# Patient Record
Sex: Female | Born: 1990 | Hispanic: Yes | Marital: Married | Smoking: Former smoker
Health system: Southern US, Community
[De-identification: ages and names within clinical notes are randomized; demographics above are authoritative.]

## PROBLEM LIST (undated history)

## (undated) ENCOUNTER — Inpatient Hospital Stay (HOSPITAL_COMMUNITY): Payer: Self-pay

## (undated) DIAGNOSIS — Z789 Other specified health status: Secondary | ICD-10-CM

## (undated) DIAGNOSIS — O3680X0 Pregnancy with inconclusive fetal viability, not applicable or unspecified: Secondary | ICD-10-CM

## (undated) DIAGNOSIS — N390 Urinary tract infection, site not specified: Secondary | ICD-10-CM

## (undated) HISTORY — PX: DILATION AND CURETTAGE OF UTERUS: SHX78

---

## 2015-11-25 ENCOUNTER — Inpatient Hospital Stay (HOSPITAL_COMMUNITY)
Admission: AD | Admit: 2015-11-25 | Discharge: 2015-11-25 | Disposition: A | Discharge status: Home / Self Care | Source: Ambulatory Visit | Attending: Obstetrics & Gynecology | Admitting: Obstetrics & Gynecology

## 2015-11-25 ENCOUNTER — Encounter (HOSPITAL_COMMUNITY): Payer: Self-pay | Admitting: *Deleted

## 2015-11-25 DIAGNOSIS — R102 Pelvic and perineal pain: Secondary | ICD-10-CM | POA: Diagnosis present

## 2015-11-25 DIAGNOSIS — O9989 Other specified diseases and conditions complicating pregnancy, childbirth and the puerperium: Secondary | ICD-10-CM

## 2015-11-25 DIAGNOSIS — R109 Unspecified abdominal pain: Secondary | ICD-10-CM

## 2015-11-25 DIAGNOSIS — O26893 Other specified pregnancy related conditions, third trimester: Secondary | ICD-10-CM | POA: Diagnosis not present

## 2015-11-25 DIAGNOSIS — Z87891 Personal history of nicotine dependence: Secondary | ICD-10-CM | POA: Diagnosis not present

## 2015-11-25 DIAGNOSIS — Z3A29 29 weeks gestation of pregnancy: Secondary | ICD-10-CM | POA: Diagnosis not present

## 2015-11-25 DIAGNOSIS — O26899 Other specified pregnancy related conditions, unspecified trimester: Secondary | ICD-10-CM

## 2015-11-25 HISTORY — DX: Urinary tract infection, site not specified: N39.0

## 2015-11-25 LAB — URINALYSIS, ROUTINE W REFLEX MICROSCOPIC
BILIRUBIN URINE: NEGATIVE
Glucose, UA: NEGATIVE mg/dL
Hgb urine dipstick: NEGATIVE
Ketones, ur: NEGATIVE mg/dL
LEUKOCYTES UA: NEGATIVE
NITRITE: NEGATIVE
Protein, ur: NEGATIVE mg/dL
SPECIFIC GRAVITY, URINE: 1.01 (ref 1.005–1.030)
pH: 6.5 (ref 5.0–8.0)

## 2015-11-25 LAB — POCT PREGNANCY, URINE: Preg Test, Ur: POSITIVE — AB

## 2015-11-25 NOTE — MAU Note (Signed)
Pt presents to MAU with complaints of lower abdominal since for the last couple of days. Denies any vaginal bleeding or abnormal discharge. PT has received care in [place] and just moved here. States she has one child born 2013 and lost twins in 2014 at 8 weeks.

## 2015-11-25 NOTE — Discharge Instructions (Signed)

## 2015-11-25 NOTE — Progress Notes (Signed)
Provider notified of pt arrival to MAU and pt complaints.  Provider states to continue to monitor the baby and notify her of urine results.

## 2015-11-25 NOTE — MAU Provider Note (Signed)
History   G3P1011 @ 29 wks in with c/o sharp shooting pains into vagina for 3 days.  CSN: [id number]  Arrival date & time 11/25/15  1651   None     Chief Complaint  Patient presents with  . Contractions    HPI  Past Medical History  Diagnosis Date  . UTI (urinary tract infection)     Past Surgical History  Procedure Laterality Date  . Dilation and curettage of uterus      History reviewed. No pertinent family history.  Social History  Substance Use Topics  . Smoking status: Former Games developer  . Smokeless tobacco: None  . Alcohol Use: No    OB History    Gravida Para Term Preterm AB TAB SAB Ectopic Multiple Living   Review of Systems  Constitutional: Negative.   HENT: Negative.   Eyes: Negative.   Respiratory: Negative.   Cardiovascular: Negative.   Gastrointestinal: Positive for abdominal pain.  Endocrine: Negative.   Genitourinary: Negative.   Musculoskeletal: Negative.   Skin: Negative.   Allergic/Immunologic: Negative.   Neurological: Negative.   Hematological: Negative.   Psychiatric/Behavioral: Negative.     Allergies  Review of patient's allergies indicates no known allergies.  Home Medications  No current outpatient prescriptions on file.  BP 108/84 mmHg  Pulse 79  Temp(Src) 98.2 F (36.8 C)  Resp 18  LMP 05/06/2015  Physical Exam  Constitutional: She is oriented to person, place, and time. She appears well-developed and well-nourished.  HENT:  Head: Normocephalic.  Eyes: Pupils are equal, round, and reactive to light.  Neck: Normal range of motion.  Cardiovascular: Normal rate, regular rhythm, normal heart sounds and intact distal pulses.   Pulmonary/Chest: Effort normal and breath sounds normal.  Abdominal: Soft. Bowel sounds are normal.  Genitourinary: Vagina normal.  Musculoskeletal: Normal range of motion.  Neurological: She is alert and oriented to person, place, and time. She has normal reflexes.  Skin:  Skin is warm and dry.  Psychiatric: She has a normal mood and affect. Her behavior is normal. Judgment and thought content normal.    MAU Course  Procedures (including critical care time)  Labs Reviewed  POCT PREGNANCY, URINE - Abnormal; Notable for the following:    Preg Test, Ur POSITIVE (*)    All other components within normal limits  URINALYSIS, ROUTINE W REFLEX MICROSCOPIC (NOT AT [facility])   No results found.   No diagnosis found.    MDM  SVE cl/th/post/high. FHR pattern reassurring. Common discomfort of pregnancy. Will d/c home  NO EVIDENCE OF PRETERM LABOR GIVEN LACK OF CONTRACTIONS AND CLOSED CERVICAL EXAM.     Attestation of Attending Supervision of Advanced Practitioner (CNM/NP): Evaluation and management procedures were performed by the Advanced Practitioner under my supervision and collaboration. I have reviewed the Advanced Practitioner's note and chart, and I agree with the management and plan.  Lesly Dukes., MD 8:17 PM

## 2016-09-29 ENCOUNTER — Encounter (HOSPITAL_COMMUNITY): Payer: Self-pay

## 2017-04-27 ENCOUNTER — Inpatient Hospital Stay (HOSPITAL_COMMUNITY)

## 2017-04-27 ENCOUNTER — Encounter (HOSPITAL_COMMUNITY): Payer: Self-pay

## 2017-04-27 ENCOUNTER — Inpatient Hospital Stay (HOSPITAL_COMMUNITY)
Admission: AD | Admit: 2017-04-27 | Discharge: 2017-04-27 | Disposition: A | Discharge status: Home / Self Care | Payer: Self-pay | Attending: Obstetrics & Gynecology | Admitting: Obstetrics & Gynecology

## 2017-04-27 ENCOUNTER — Inpatient Hospital Stay (HOSPITAL_COMMUNITY)
Admission: AD | Admit: 2017-04-27 | Discharge: 2017-04-27 | Disposition: A | Discharge status: Home / Self Care | Source: Ambulatory Visit | Attending: Obstetrics & Gynecology | Admitting: Obstetrics & Gynecology

## 2017-04-27 DIAGNOSIS — Z3201 Encounter for pregnancy test, result positive: Secondary | ICD-10-CM | POA: Diagnosis not present

## 2017-04-27 DIAGNOSIS — O3680X0 Pregnancy with inconclusive fetal viability, not applicable or unspecified: Secondary | ICD-10-CM | POA: Diagnosis not present

## 2017-04-27 DIAGNOSIS — Z87440 Personal history of urinary (tract) infections: Secondary | ICD-10-CM | POA: Insufficient documentation

## 2017-04-27 DIAGNOSIS — B9689 Other specified bacterial agents as the cause of diseases classified elsewhere: Secondary | ICD-10-CM | POA: Diagnosis present

## 2017-04-27 DIAGNOSIS — Z87891 Personal history of nicotine dependence: Secondary | ICD-10-CM | POA: Insufficient documentation

## 2017-04-27 DIAGNOSIS — R102 Pelvic and perineal pain: Secondary | ICD-10-CM | POA: Diagnosis present

## 2017-04-27 DIAGNOSIS — Z3A01 Less than 8 weeks gestation of pregnancy: Secondary | ICD-10-CM | POA: Insufficient documentation

## 2017-04-27 DIAGNOSIS — O23591 Infection of other part of genital tract in pregnancy, first trimester: Secondary | ICD-10-CM | POA: Insufficient documentation

## 2017-04-27 DIAGNOSIS — N760 Acute vaginitis: Secondary | ICD-10-CM | POA: Diagnosis not present

## 2017-04-27 DIAGNOSIS — O26891 Other specified pregnancy related conditions, first trimester: Secondary | ICD-10-CM

## 2017-04-27 DIAGNOSIS — Z9889 Other specified postprocedural states: Secondary | ICD-10-CM | POA: Insufficient documentation

## 2017-04-27 DIAGNOSIS — R1032 Left lower quadrant pain: Secondary | ICD-10-CM | POA: Insufficient documentation

## 2017-04-27 DIAGNOSIS — R109 Unspecified abdominal pain: Secondary | ICD-10-CM

## 2017-04-27 HISTORY — DX: Pregnancy with inconclusive fetal viability, not applicable or unspecified: O36.80X0

## 2017-04-27 HISTORY — DX: Other specified health status: Z78.9

## 2017-04-27 LAB — CBC WITH DIFFERENTIAL/PLATELET
BASOS ABS: 0 10*3/uL (ref 0.0–0.1)
Basophils Relative: 0 %
EOS ABS: 0.2 10*3/uL (ref 0.0–0.7)
EOS PCT: 2 %
HCT: 44 % (ref 36.0–46.0)
Hemoglobin: 15.2 g/dL — ABNORMAL HIGH (ref 12.0–15.0)
LYMPHS ABS: 2.9 10*3/uL (ref 0.7–4.0)
LYMPHS PCT: 41 %
MCH: 30.8 pg (ref 26.0–34.0)
MCHC: 34.5 g/dL (ref 30.0–36.0)
MCV: 89.2 fL (ref 78.0–100.0)
MONO ABS: 0.4 10*3/uL (ref 0.1–1.0)
Monocytes Relative: 6 %
Neutro Abs: 3.7 10*3/uL (ref 1.7–7.7)
Neutrophils Relative %: 51 %
Platelets: 253 10*3/uL (ref 150–400)
RBC: 4.93 MIL/uL (ref 3.87–5.11)
RDW: 13.3 % (ref 11.5–15.5)
WBC: 7.3 10*3/uL (ref 4.0–10.5)

## 2017-04-27 LAB — URINALYSIS, ROUTINE W REFLEX MICROSCOPIC
Bilirubin Urine: NEGATIVE
Glucose, UA: NEGATIVE mg/dL
HGB URINE DIPSTICK: NEGATIVE
KETONES UR: 5 mg/dL — AB
Leukocytes, UA: NEGATIVE
Nitrite: NEGATIVE
PH: 6 (ref 5.0–8.0)
Protein, ur: NEGATIVE mg/dL
SPECIFIC GRAVITY, URINE: 1.02 (ref 1.005–1.030)

## 2017-04-27 LAB — WET PREP, GENITAL
SPERM: NONE SEEN
Trich, Wet Prep: NONE SEEN
Yeast Wet Prep HPF POC: NONE SEEN

## 2017-04-27 LAB — POCT PREGNANCY, URINE: Preg Test, Ur: POSITIVE — AB

## 2017-04-27 LAB — ABO/RH: ABO/RH(D): O POS

## 2017-04-27 LAB — HCG, QUANTITATIVE, PREGNANCY: hCG, Beta Chain, Quant, S: 2435 m[IU]/mL — ABNORMAL HIGH (ref <5)

## 2017-04-27 MED ORDER — METRONIDAZOLE 0.75 % VA GEL: 1.0000 | Freq: Every day | VAGINAL | 0 refills | Status: AC

## 2017-04-27 NOTE — MAU Note (Signed)
Pt here with cramping today; took HPT 2 days ago and was positive. Had Nexplanon removed in April; Had LMP 03/22/17 and was light. Denies any bleeding today.

## 2017-04-27 NOTE — MAU Provider Note (Signed)
History     CSN: [id number]  Arrival date and time: 04/27/17 1954  Provider initial contact with patient: 2135    Chief Complaint  Patient presents with  . Abdominal Cramping   HPI  Ms. Brianna Gordon is a 26 yo G4P2012 at 5.[redacted] wks gestation presenting to MAU with complaints of 2 (+) HPTs and cramping today.  She also reports some vaginal itching that started yesterday.  She used OTC Monistat internally and externally last night.  She denies VB.  She had her Nexplanon removed 01/2017.  Her LMP was 03/22/2017.  Past Medical History:  Diagnosis Date  . Medical history non-contributory   . UTI (urinary tract infection)     Past Surgical History:  Procedure Laterality Date  . DILATION AND CURETTAGE OF UTERUS      No family history on file.  Social History  Substance Use Topics  . Smoking status: Former Games developer  . Smokeless tobacco: Not on file  . Alcohol use No    Allergies: No Known Allergies  Prescriptions Prior to Admission  Medication Sig Dispense Refill Last Dose  . Prenatal Vit-Fe Fumarate-FA (MULTIVITAMIN-PRENATAL) 27-0.8 MG TABS tablet Take 1 tablet by mouth daily at 12 noon.   11/24/2015 at Unknown time    Review of Systems  Constitutional: Negative.   HENT: Negative.   Eyes: Negative.   Respiratory: Negative.   Cardiovascular: Negative.   Gastrointestinal: Positive for abdominal pain (LLQ).  Endocrine: Negative.   Genitourinary: Positive for pelvic pain (LLQ) and vaginal discharge (and irritation).  Musculoskeletal: Negative.   Skin: Negative.   Allergic/Immunologic: Negative.   Neurological: Negative.   Hematological: Negative.   Psychiatric/Behavioral: Negative.    Physical Exam   Blood pressure 126/75, pulse 81, temperature 98.3 F (36.8 C), temperature source Oral, resp. rate 18, height 5\' 1"  (1.549 m), weight 61.2 kg (135 lb), last menstrual period 03/22/2017, SpO2 99 %, unknown if currently breastfeeding.  Physical Exam  Constitutional: She  is oriented to person, place, and time. She appears well-developed and well-nourished.  HENT:  Head: Normocephalic.  Eyes: Pupils are equal, round, and reactive to light.  Neck: Normal range of motion.  Cardiovascular: Normal rate, regular rhythm, normal heart sounds and intact distal pulses.   Respiratory: Effort normal and breath sounds normal.  GI: Soft. Bowel sounds are normal.  Genitourinary: Pelvic exam was performed with patient supine. Uterus is enlarged (slightly). Left adnexum displays tenderness (not rebound). Vaginal discharge (thick, white) found.  Musculoskeletal: Normal range of motion.  Neurological: She is alert and oriented to person, place, and time. She has normal reflexes.  Skin: Skin is warm and dry.  Psychiatric: She has a normal mood and affect. Her behavior is normal. Judgment and thought content normal.    MAU Course  Procedures  MDM CCUA UPT ABO/Rh CBCD HCG OB U/S <14wks OB U/S Transvaginal   Results for orders placed or performed during the hospital encounter of 04/27/17 (from the past 24 hour(s))  Urinalysis, Routine w reflex microscopic     Status: Abnormal   Collection Time: 04/27/17  8:10 PM  Result Value Ref Range   Color, Urine YELLOW YELLOW   APPearance CLEAR CLEAR   Specific Gravity, Urine 1.020 1.005 - 1.030   pH 6.0 5.0 - 8.0   Glucose, UA NEGATIVE NEGATIVE mg/dL   Hgb urine dipstick NEGATIVE NEGATIVE   Bilirubin Urine NEGATIVE NEGATIVE   Ketones, ur 5 (A) NEGATIVE mg/dL   Protein, ur NEGATIVE NEGATIVE mg/dL   Nitrite NEGATIVE  NEGATIVE   Leukocytes, UA NEGATIVE NEGATIVE  CBC with Differential/Platelet     Status: Abnormal   Collection Time: 04/27/17  8:19 PM  Result Value Ref Range   WBC 7.3 4.0 - 10.5 K/uL   RBC 4.93 3.87 - 5.11 MIL/uL   Hemoglobin 15.2 (H) 12.0 - 15.0 g/dL   HCT 16.1 09.6 - 04.5 %   MCV 89.2 78.0 - 100.0 fL   MCH 30.8 26.0 - 34.0 pg   MCHC 34.5 30.0 - 36.0 g/dL   RDW 40.9 81.1 - 91.4 %   Platelets 253 150 -  400 K/uL   Neutrophils Relative % 51 %   Neutro Abs 3.7 1.7 - 7.7 K/uL   Lymphocytes Relative 41 %   Lymphs Abs 2.9 0.7 - 4.0 K/uL   Monocytes Relative 6 %   Monocytes Absolute 0.4 0.1 - 1.0 K/uL   Eosinophils Relative 2 %   Eosinophils Absolute 0.2 0.0 - 0.7 K/uL   Basophils Relative 0 %   Basophils Absolute 0.0 0.0 - 0.1 K/uL  hCG, quantitative, pregnancy     Status: Abnormal   Collection Time: 04/27/17  8:19 PM  Result Value Ref Range   hCG, Beta Chain, Quant, S 2,435 (H) <5 mIU/mL  ABO/Rh     Status: None (Preliminary result)   Collection Time: 04/27/17  8:20 PM  Result Value Ref Range   ABO/RH(D) O POS   Pregnancy, urine POC     Status: Abnormal   Collection Time: 04/27/17  8:20 PM  Result Value Ref Range   Preg Test, Ur POSITIVE (A) NEGATIVE  Wet prep, genital     Status: Abnormal   Collection Time: 04/27/17  9:45 PM  Result Value Ref Range   Yeast Wet Prep HPF POC NONE SEEN NONE SEEN   Trich, Wet Prep NONE SEEN NONE SEEN   Clue Cells Wet Prep HPF POC PRESENT (A) NONE SEEN   WBC, Wet Prep HPF POC MODERATE (A) NONE SEEN   Sperm NONE SEEN    US Ob Comp Less 14 Wks  Result Date: 04/27/2017 CLINICAL DATA:  26 year old pregnant female with pelvic pain. Estimated gestational age of [redacted] weeks 1 day by LMP. Beta HCG of 2,435 EXAM: OBSTETRIC <14 WK [place] AND TRANSVAGINAL OB US TECHNIQUE: Both transabdominal and transvaginal ultrasound examinations were performed for complete evaluation of the gestation as well as the maternal uterus, adnexal regions, and pelvic cul-de-sac. Transvaginal technique was performed to assess early pregnancy. COMPARISON:  None. FINDINGS: Intrauterine gestational sac: Single Yolk sac:  Not Visualized. Embryo:  Not Visualized. MSD: 5.1  mm   5 w   2  d Subchorionic hemorrhage:  None visualized. Maternal uterus/adnexae: The ovaries bilaterally are unremarkable. No free fluid or adnexal mass. IMPRESSION: Probable early intrauterine gestational sac, but no yolk sac,  fetal pole, or cardiac activity yet visualized. Recommend follow-up quantitative B-HCG levels and follow-up US in 14 days to assess viability. This recommendation follows SRU consensus guidelines: Diagnostic Criteria for Nonviable Pregnancy Early in the First Trimester. Malva Limes Med 2013; 782:9562-13. No free fluid or adnexal mass. Electronically Signed   By: Harmon Pier M.D.   On: 04/27/2017 22:17   US Ob Transvaginal  Result Date: 04/27/2017 CLINICAL DATA:  26 year old pregnant female with pelvic pain. Estimated gestational age of [redacted] weeks 1 day by LMP. Beta HCG of 2,435 EXAM: OBSTETRIC <14 WK [place] AND TRANSVAGINAL OB US TECHNIQUE: Both transabdominal and transvaginal ultrasound examinations were performed for complete evaluation of  the gestation as well as the maternal uterus, adnexal regions, and pelvic cul-de-sac. Transvaginal technique was performed to assess early pregnancy. COMPARISON:  None. FINDINGS: Intrauterine gestational sac: Single Yolk sac:  Not Visualized. Embryo:  Not Visualized. MSD: 5.1  mm   5 w   2  d Subchorionic hemorrhage:  None visualized. Maternal uterus/adnexae: The ovaries bilaterally are unremarkable. No free fluid or adnexal mass. IMPRESSION: Probable early intrauterine gestational sac, but no yolk sac, fetal pole, or cardiac activity yet visualized. Recommend follow-up quantitative B-HCG levels and follow-up US in 14 days to assess viability. This recommendation follows SRU consensus guidelines: Diagnostic Criteria for Nonviable Pregnancy Early in the First Trimester. Malva Limes Engl J Med 2013; 161:0960-45; 369:1443-51. No free fluid or adnexal mass. Electronically Signed   By: Harmon PierJeffrey  Hu M.D.   On: 04/27/2017 22:17    Assessment and Plan  Bacterial vaginitis  Positive urine pregnancy test - Plan: US OB Comp Less 14 Wks, US OB Comp Less 14 Wks  Abdominal pain during pregnancy in first trimester - gestational sac, no embryo, no yolk sac, no fetal pole, no FHR seen  - Plan: US MFM OB LIMITED in 2  wks / - Patient states she "will be in [place] at that time"  - F/U with [facility] after U/S  Pregnancy with inconclusive fetal viability, single or unspecified fetus    Raelyn Moraolitta Andrina Locken, MSN, CNM 04/27/2017, 10:52 PM

## 2017-04-28 ENCOUNTER — Encounter (HOSPITAL_COMMUNITY): Payer: Self-pay

## 2017-04-28 ENCOUNTER — Telehealth: Payer: Self-pay | Admitting: *Deleted

## 2017-04-28 DIAGNOSIS — Z0189 Encounter for other specified special examinations: Secondary | ICD-10-CM

## 2017-04-28 LAB — GC/CHLAMYDIA PROBE AMP (~~LOC~~) NOT AT ARMC
CHLAMYDIA, DNA PROBE: NEGATIVE
Neisseria Gonorrhea: NEGATIVE

## 2017-04-28 LAB — HIV ANTIBODY (ROUTINE TESTING W REFLEX): HIV Screen 4th Generation wRfx: NONREACTIVE

## 2017-04-28 NOTE — Telephone Encounter (Signed)
Ultrasound scheduled for 7/17 at 0845. Called patient and she answered stating that she couldn't talk because she is driving. She then hung up the phone.

## 2017-05-04 NOTE — Telephone Encounter (Signed)
Called patient to inform of her u/s appointment. Patient stated she is no longer in town and can not keep appointment. She will call back at a later time.

## 2017-05-12 ENCOUNTER — Ambulatory Visit (HOSPITAL_COMMUNITY)

## 2018-03-21 ENCOUNTER — Encounter (HOSPITAL_COMMUNITY): Payer: Self-pay

## 2018-06-25 IMAGING — US US OB COMP LESS 14 WK
1 series · 15 of 28 positions shown · non-contrast
Comparison: None.

CLINICAL DATA: 25-year-old pregnant female with pelvic pain.
Estimated gestational age of 5 weeks 1 day by LMP. Beta HCG of 2,435

EXAM:
OBSTETRIC <14 WK US AND TRANSVAGINAL OB US
TECHNIQUE: Both transabdominal and transvaginal ultrasound examinations were
performed for complete evaluation of the gestation as well as the
maternal uterus, adnexal regions, and pelvic cul-de-sac.
Transvaginal technique was performed to assess early pregnancy.

[Series 1: us ob comp less 14 wk · 15 of 60 slices shown]
[im 1/60]
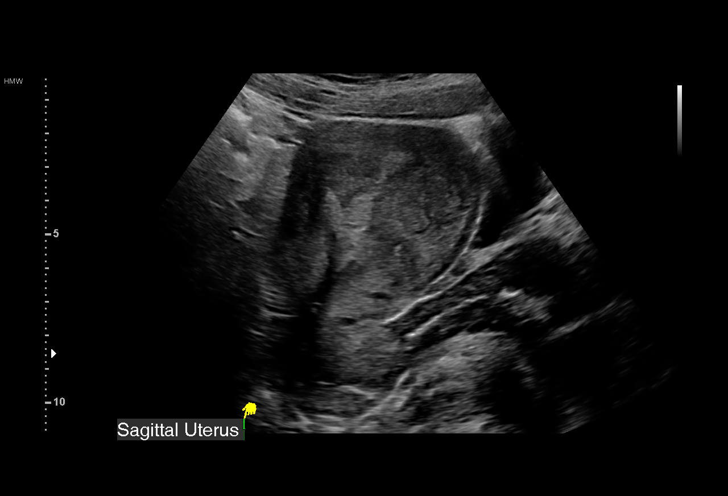
[im 5/60]
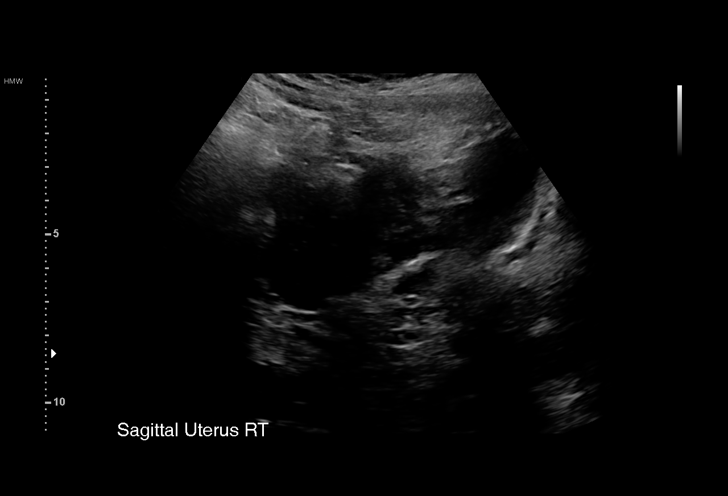
[im 9/60]
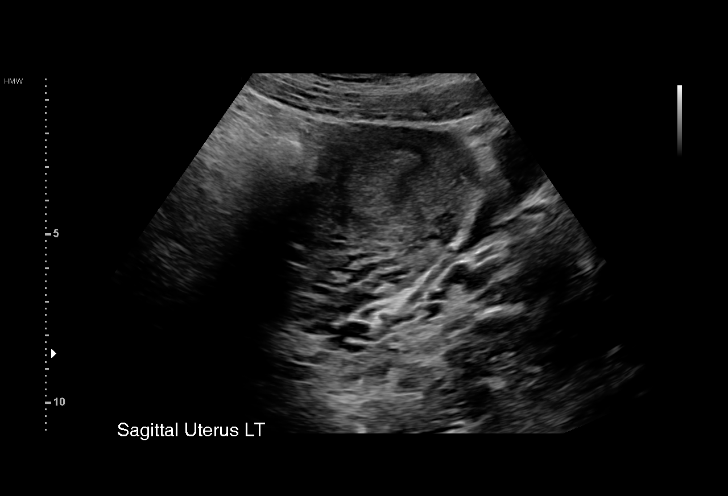
[im 14/60]
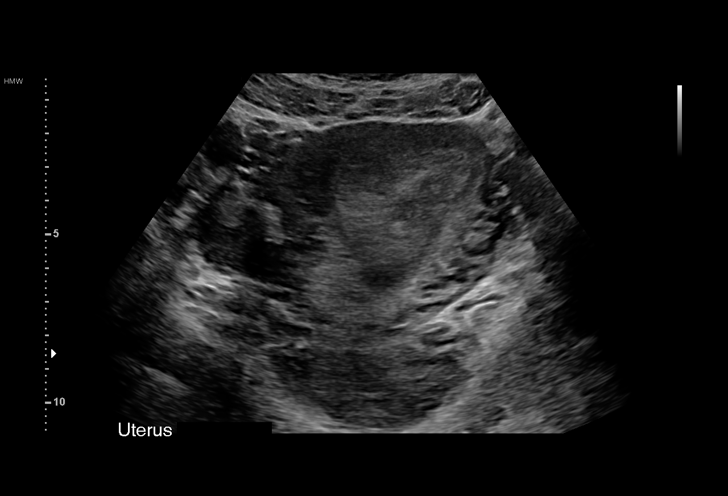
[im 18/60]
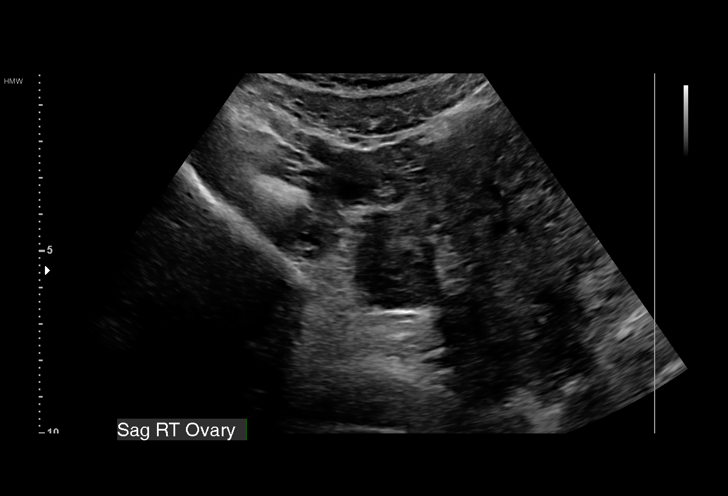
[im 22/60]
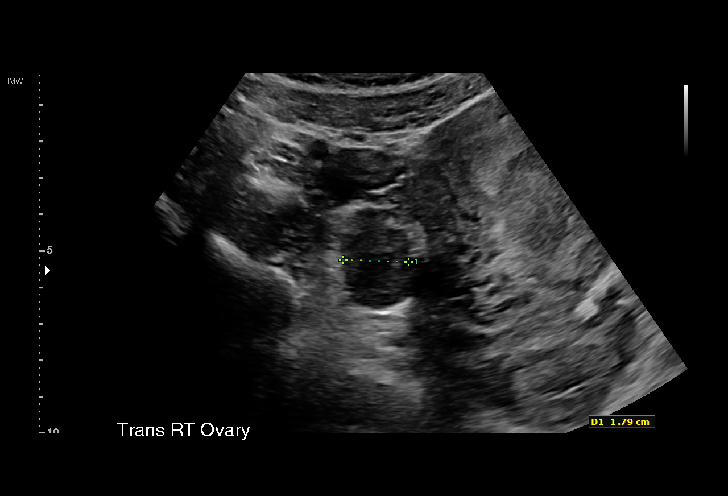
[im 27/60]
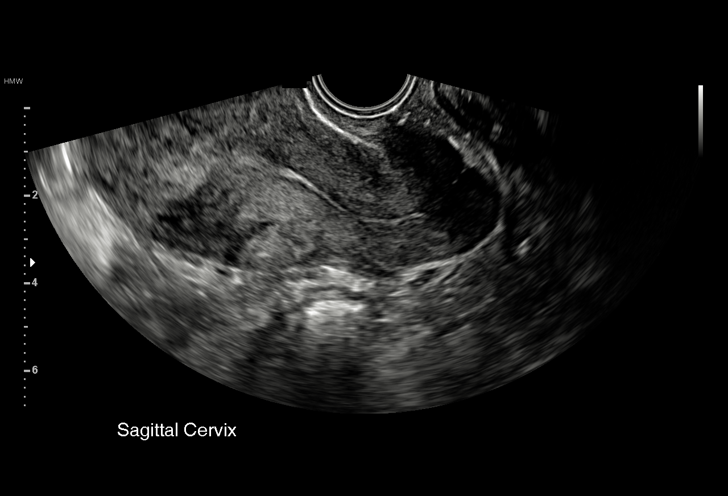
[im 31/60]
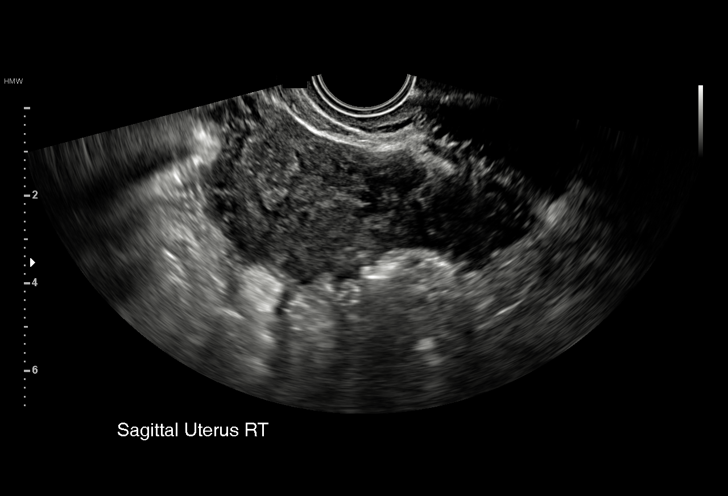
[im 33/60]
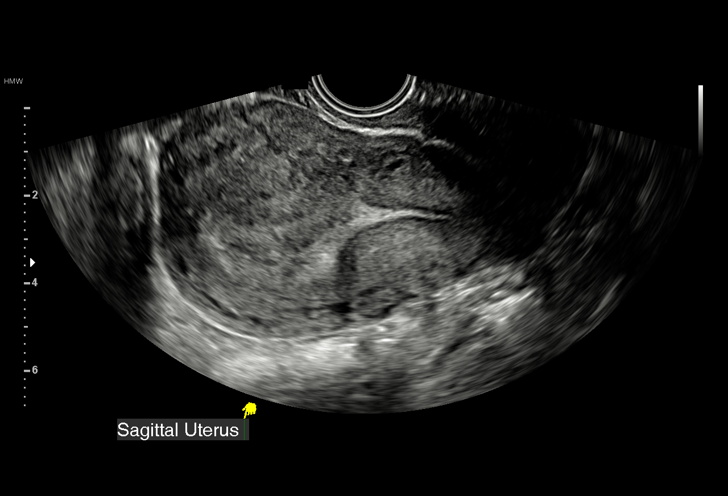
[im 38/60]
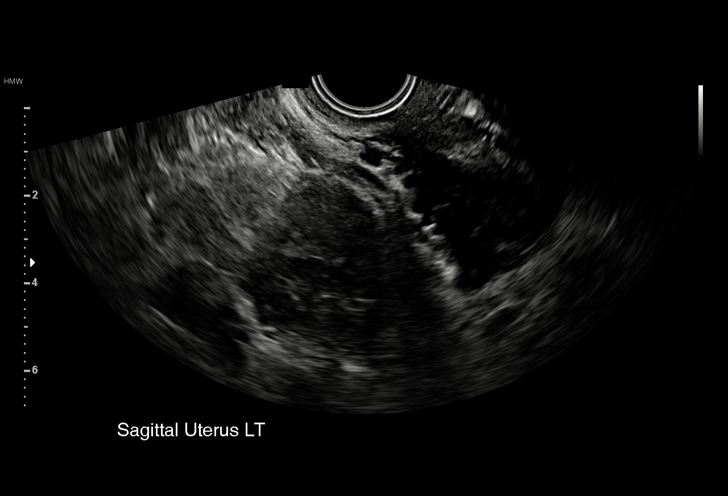
[im 42/60]
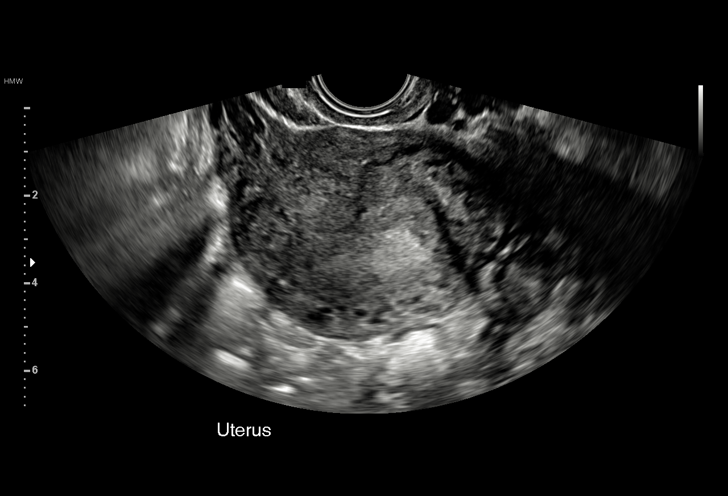
[im 46/60]
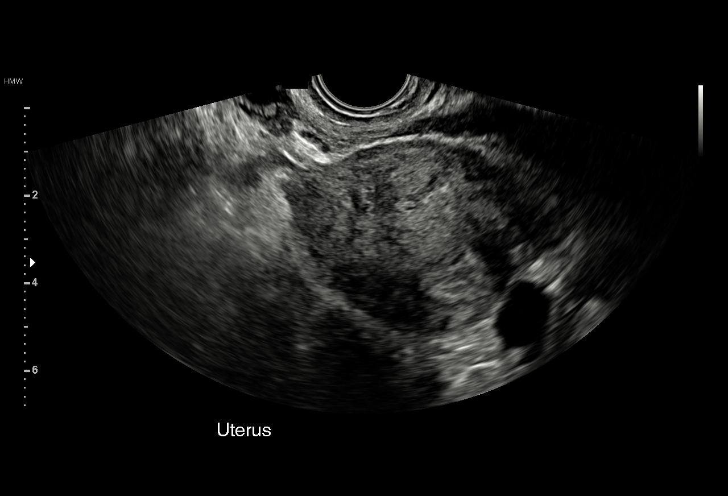
[im 51/60]
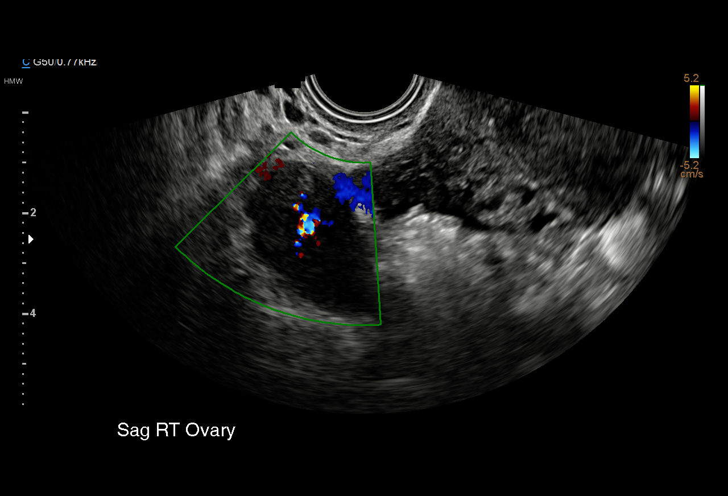
[im 55/60]
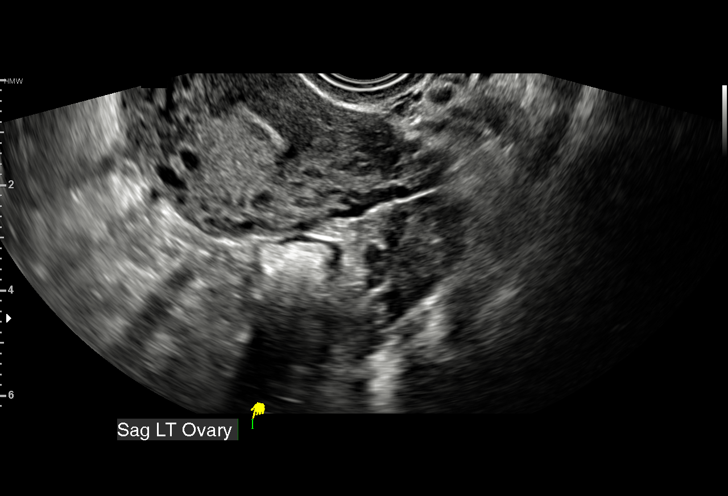
[im 60/60]
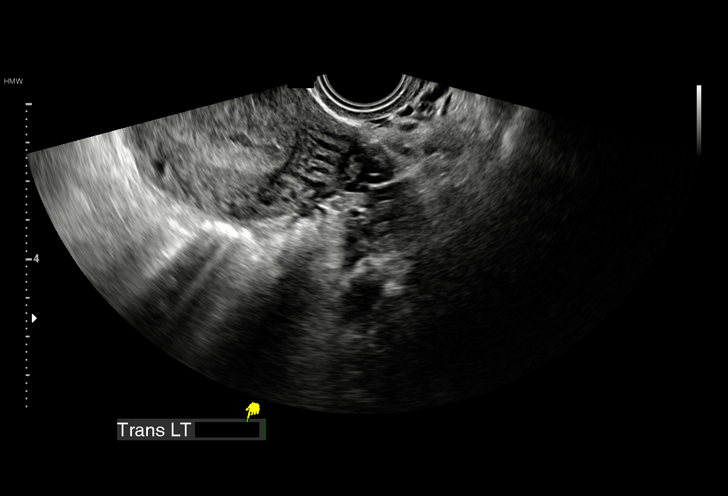

[15 of 28 positions shown; findings below may reference images not displayed]

FINDINGS: Intrauterine gestational sac: Single

Yolk sac:  Not Visualized.

Embryo:  Not Visualized.

MSD: 5.1  mm   5 w   2  d

Subchorionic hemorrhage:  None visualized.

Maternal uterus/adnexae: The ovaries bilaterally are unremarkable.

No free fluid or adnexal mass.
IMPRESSION: Probable early intrauterine gestational sac, but no yolk sac, fetal
pole, or cardiac activity yet visualized. Recommend follow-up
quantitative B-HCG levels and follow-up US in 14 days to assess
viability. This recommendation follows SRU consensus guidelines:
Diagnostic Criteria for Nonviable Pregnancy Early in the First
Trimester. N Engl J Med 1747; [DATE].

No free fluid or adnexal mass.
